# Patient Record
Sex: Female | Born: 1990 | Race: Black or African American | Hispanic: No | Marital: Single | Smoking: Current every day smoker
Health system: Southern US, Community
[De-identification: ages and names within clinical notes are randomized; demographics above are authoritative.]

---

## 2008-10-22 ENCOUNTER — Emergency Department: Payer: Self-pay | Admitting: Emergency Medicine

## 2009-12-27 ENCOUNTER — Emergency Department: Payer: Self-pay | Admitting: Emergency Medicine

## 2010-03-07 ENCOUNTER — Emergency Department: Payer: Self-pay | Admitting: Unknown Physician Specialty

## 2010-11-11 ENCOUNTER — Emergency Department: Payer: Self-pay | Admitting: Emergency Medicine

## 2011-07-19 ENCOUNTER — Emergency Department: Payer: Self-pay | Admitting: Emergency Medicine

## 2011-07-19 LAB — URINALYSIS, COMPLETE
Bacteria: NONE SEEN
Bilirubin,UR: NEGATIVE
Blood: NEGATIVE
Ketone: NEGATIVE
Ph: 6 (ref 4.5–8.0)
RBC,UR: 1 /HPF (ref 0–5)
Specific Gravity: 1.009 (ref 1.003–1.030)
WBC UR: 1 /HPF (ref 0–5)

## 2011-07-19 LAB — COMPREHENSIVE METABOLIC PANEL
Albumin: 4.1 g/dL (ref 3.4–5.0)
Alkaline Phosphatase: 72 U/L (ref 50–136)
BUN: 8 mg/dL (ref 7–18)
Calcium, Total: 9.3 mg/dL (ref 8.5–10.1)
Co2: 27 mmol/L (ref 21–32)
EGFR (African American): >60
EGFR (Non-African Amer.): >60
Glucose: 85 mg/dL (ref 65–99)
Potassium: 3.7 mmol/L (ref 3.5–5.1)
SGOT(AST): 23 U/L (ref 15–37)
SGPT (ALT): 31 U/L
Sodium: 140 mmol/L (ref 136–145)
Total Protein: 8 g/dL (ref 6.4–8.2)

## 2011-07-19 LAB — CBC WITH DIFFERENTIAL/PLATELET
Basophil #: 0.1 10*3/uL (ref 0.0–0.1)
Basophil %: 0.9 %
HCT: 44.6 % (ref 35.0–47.0)
HGB: 14.2 g/dL (ref 12.0–16.0)
MCH: 26.7 pg (ref 26.0–34.0)
MCHC: 32 g/dL (ref 32.0–36.0)
MCV: 84 fL (ref 80–100)
Monocyte %: 6.3 %
RDW: 13.6 % (ref 11.5–14.5)
WBC: 6.4 10*3/uL (ref 3.6–11.0)

## 2011-07-19 LAB — LIPASE, BLOOD: Lipase: 108 U/L (ref 73–393)

## 2011-07-19 LAB — PREGNANCY, URINE: Pregnancy Test, Urine: NEGATIVE m[IU]/mL

## 2011-07-19 LAB — AMYLASE: Amylase: 56 U/L (ref 25–115)

## 2012-03-30 IMAGING — CR DG ANKLE COMPLETE 3+V*L*
1 series · 5 of 5 positions shown · non-contrast
Comparison: none

REASON FOR EXAM: injury; pt in [place]
COMMENTS:

[Series 1: view not recorded · 0.17mm/px · 5 of 5 slices shown]
[im 1/5]
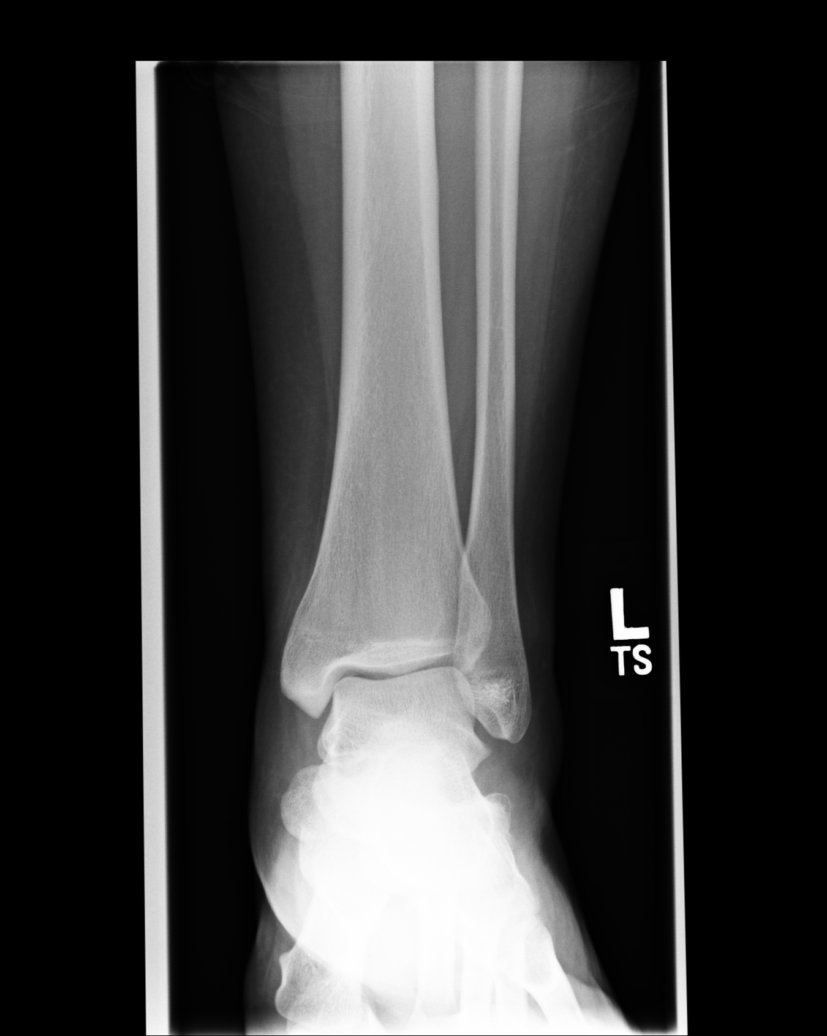
[im 2/5]
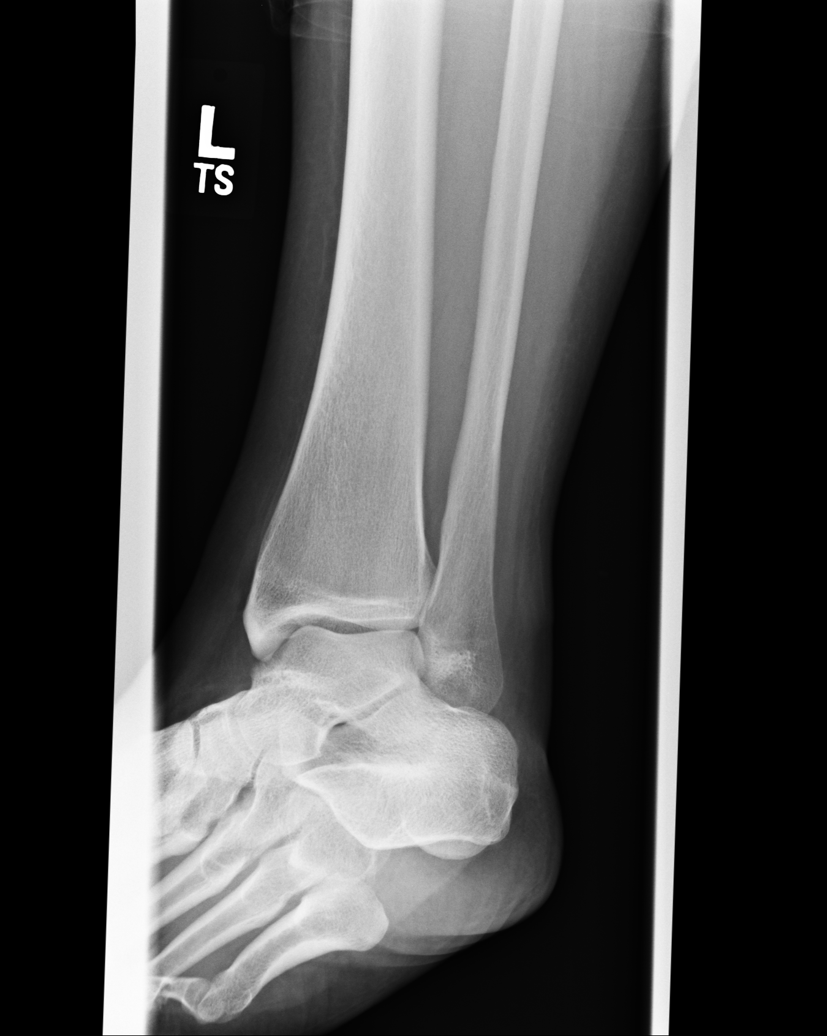
[im 3/5]
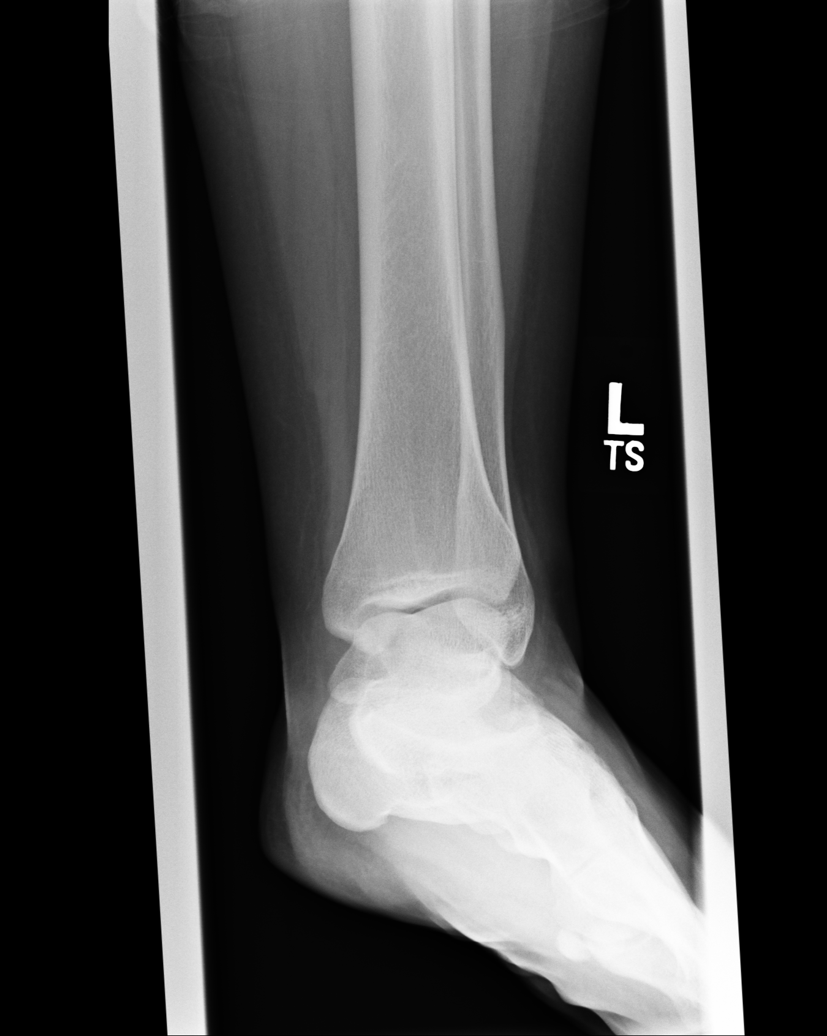
[im 4/5]
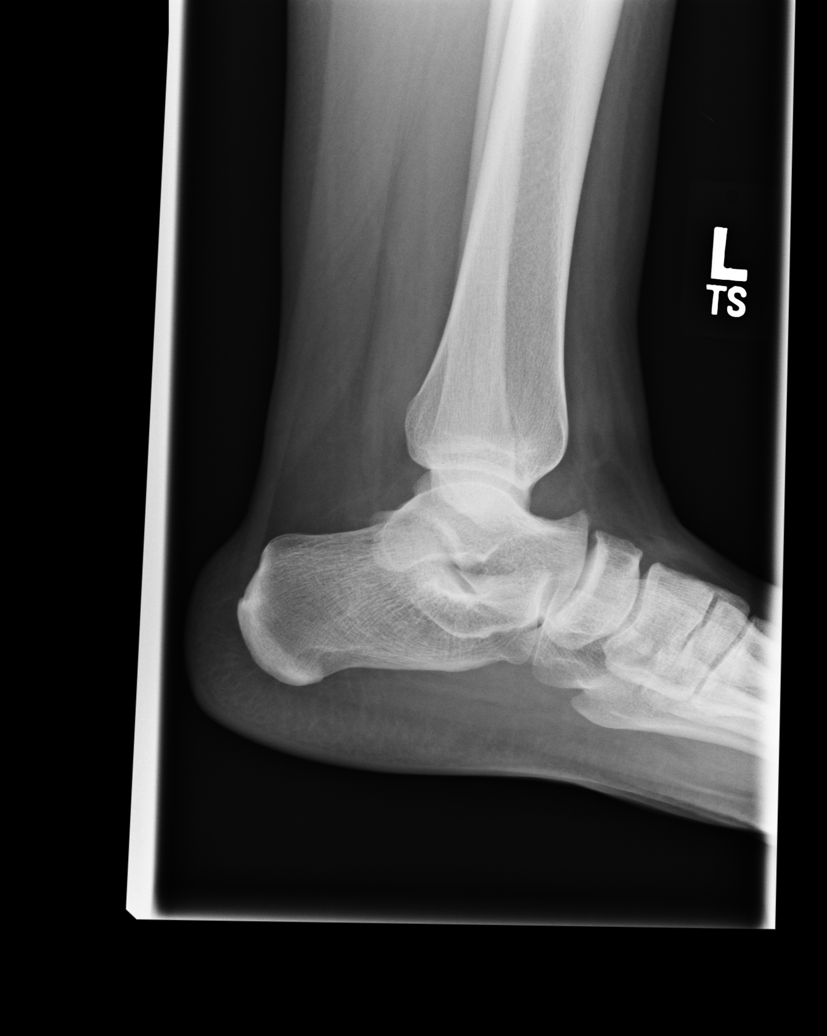
[im 5/5]
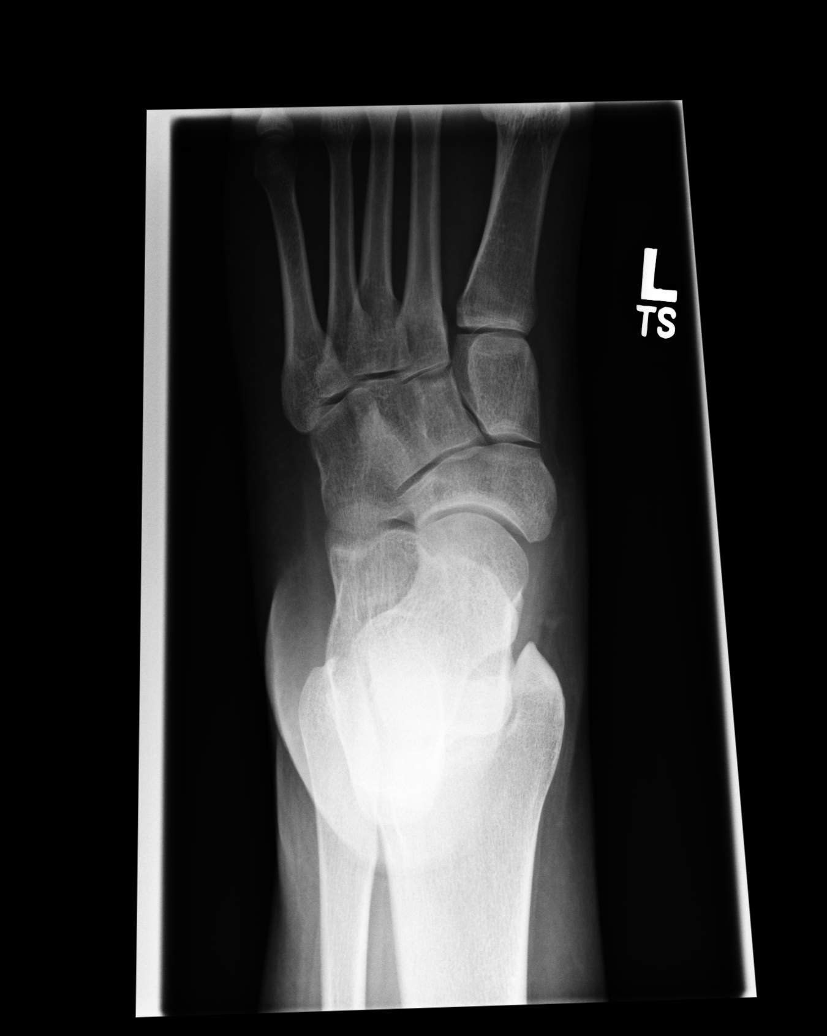

[5 of 5 positions shown; findings below may reference images not displayed]

PROCEDURE:     DXR - DXR ANKLE LEFT COMPLETE  - December 27, 2009 [DATE]

RESULT:     Five views of the left ankle are submitted. The ankle joint
mortise is preserved. The talar dome is intact. There is soft tissue
swelling over the lateral malleolus. The metatarsal bases appear intact. The
talus and calcaneus also appear intact.
IMPRESSION: I do not see acute bony abnormality of the left ankle. No
periosteal reaction is demonstrated. There is a small amount of soft tissue
swelling especially laterally. Given the patient's persistent symptoms, may
be useful to consider her for MRI of the ankle.

## 2013-01-08 ENCOUNTER — Emergency Department: Payer: Self-pay | Admitting: Emergency Medicine

## 2013-01-13 ENCOUNTER — Emergency Department: Payer: Self-pay | Admitting: Emergency Medicine

## 2014-03-01 ENCOUNTER — Emergency Department: Payer: Self-pay | Admitting: Emergency Medicine

## 2016-06-02 IMAGING — CR DG CHEST 2V
1 series · 2 of 2 positions shown · non-contrast
Comparison: None.

CLINICAL DATA: Cough, fever.

EXAM:
CHEST  2 VIEW

[Series 1: dxr chest pa (or ap) and lateral · 0.14mm/px · 2 of 2 slices shown]
[im 1/2]
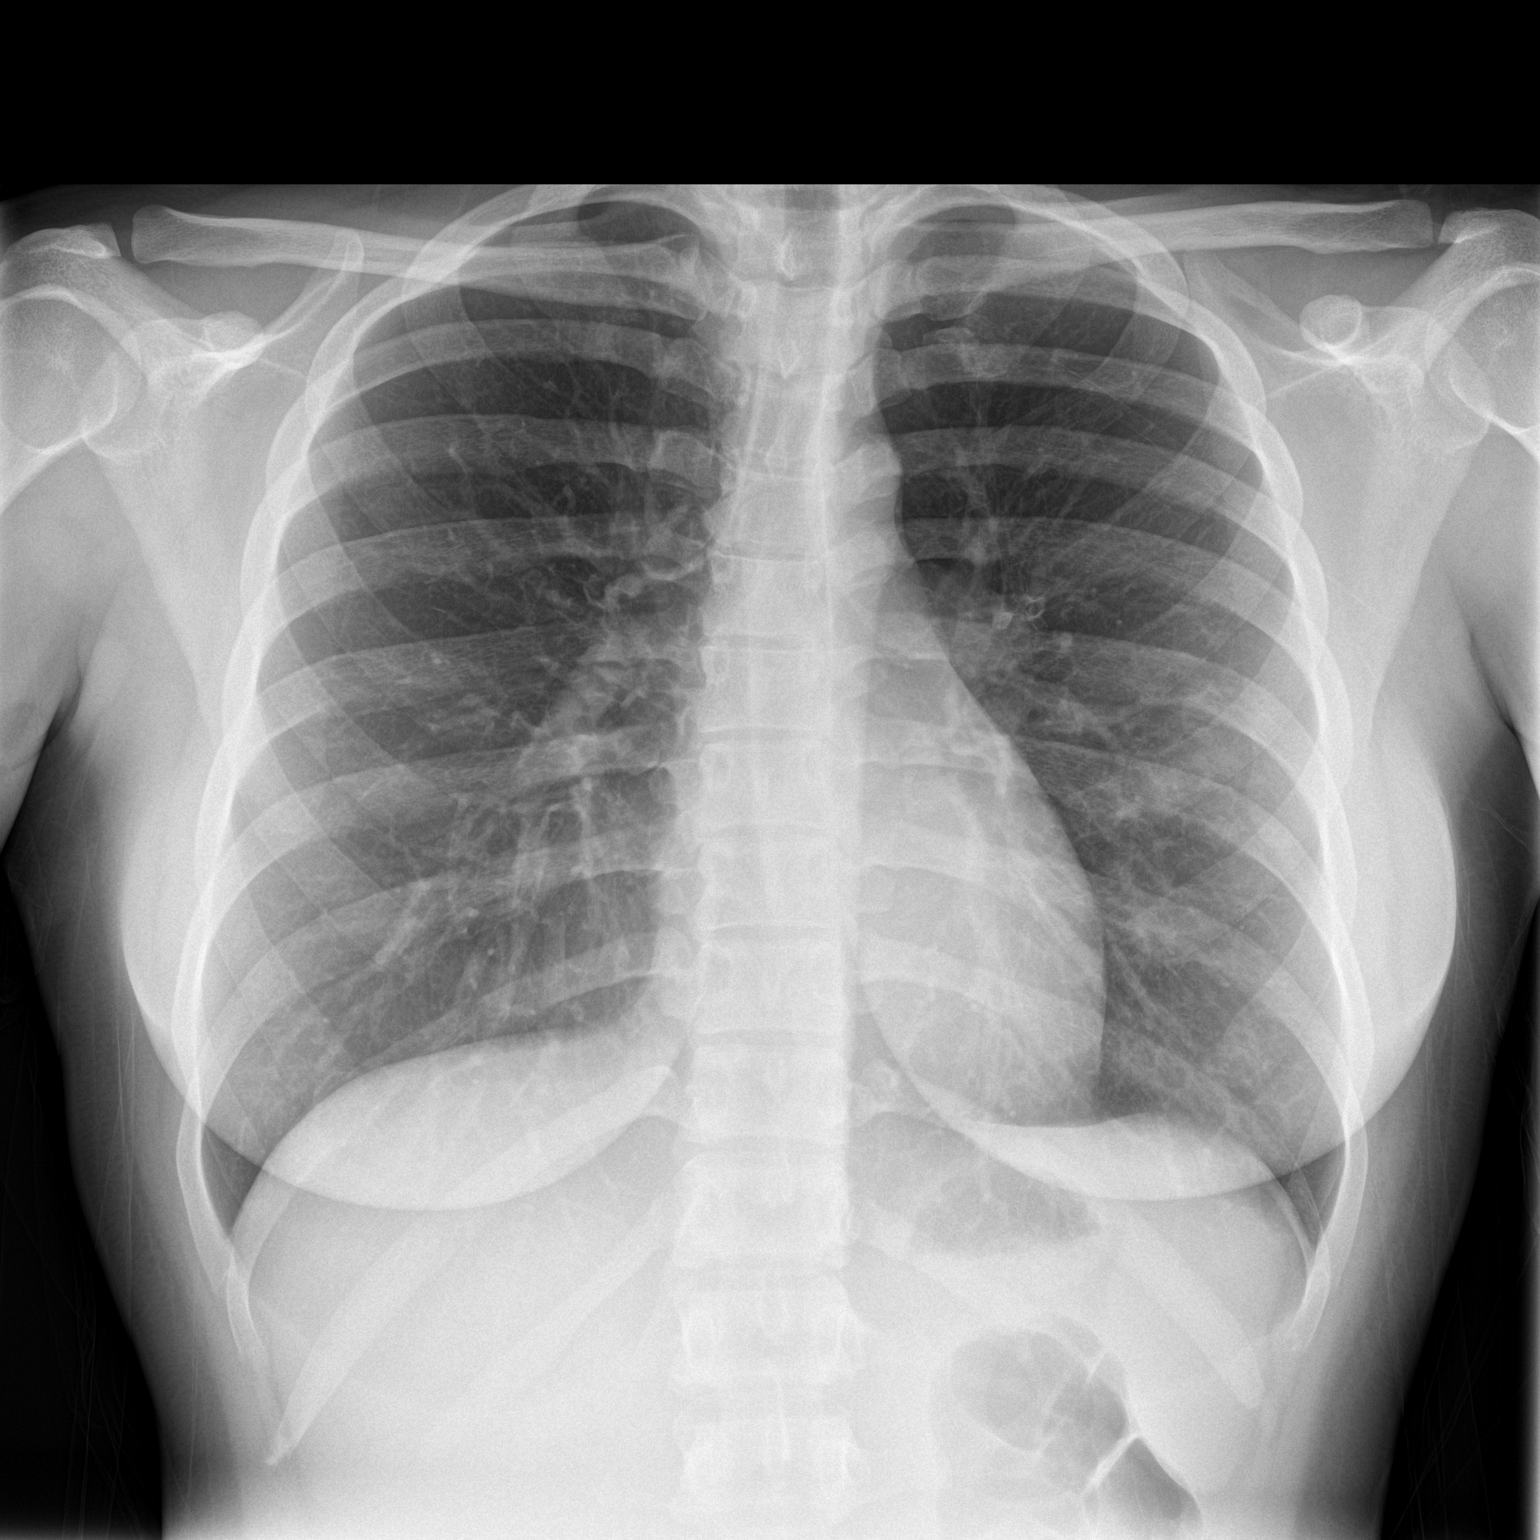
[im 2/2]
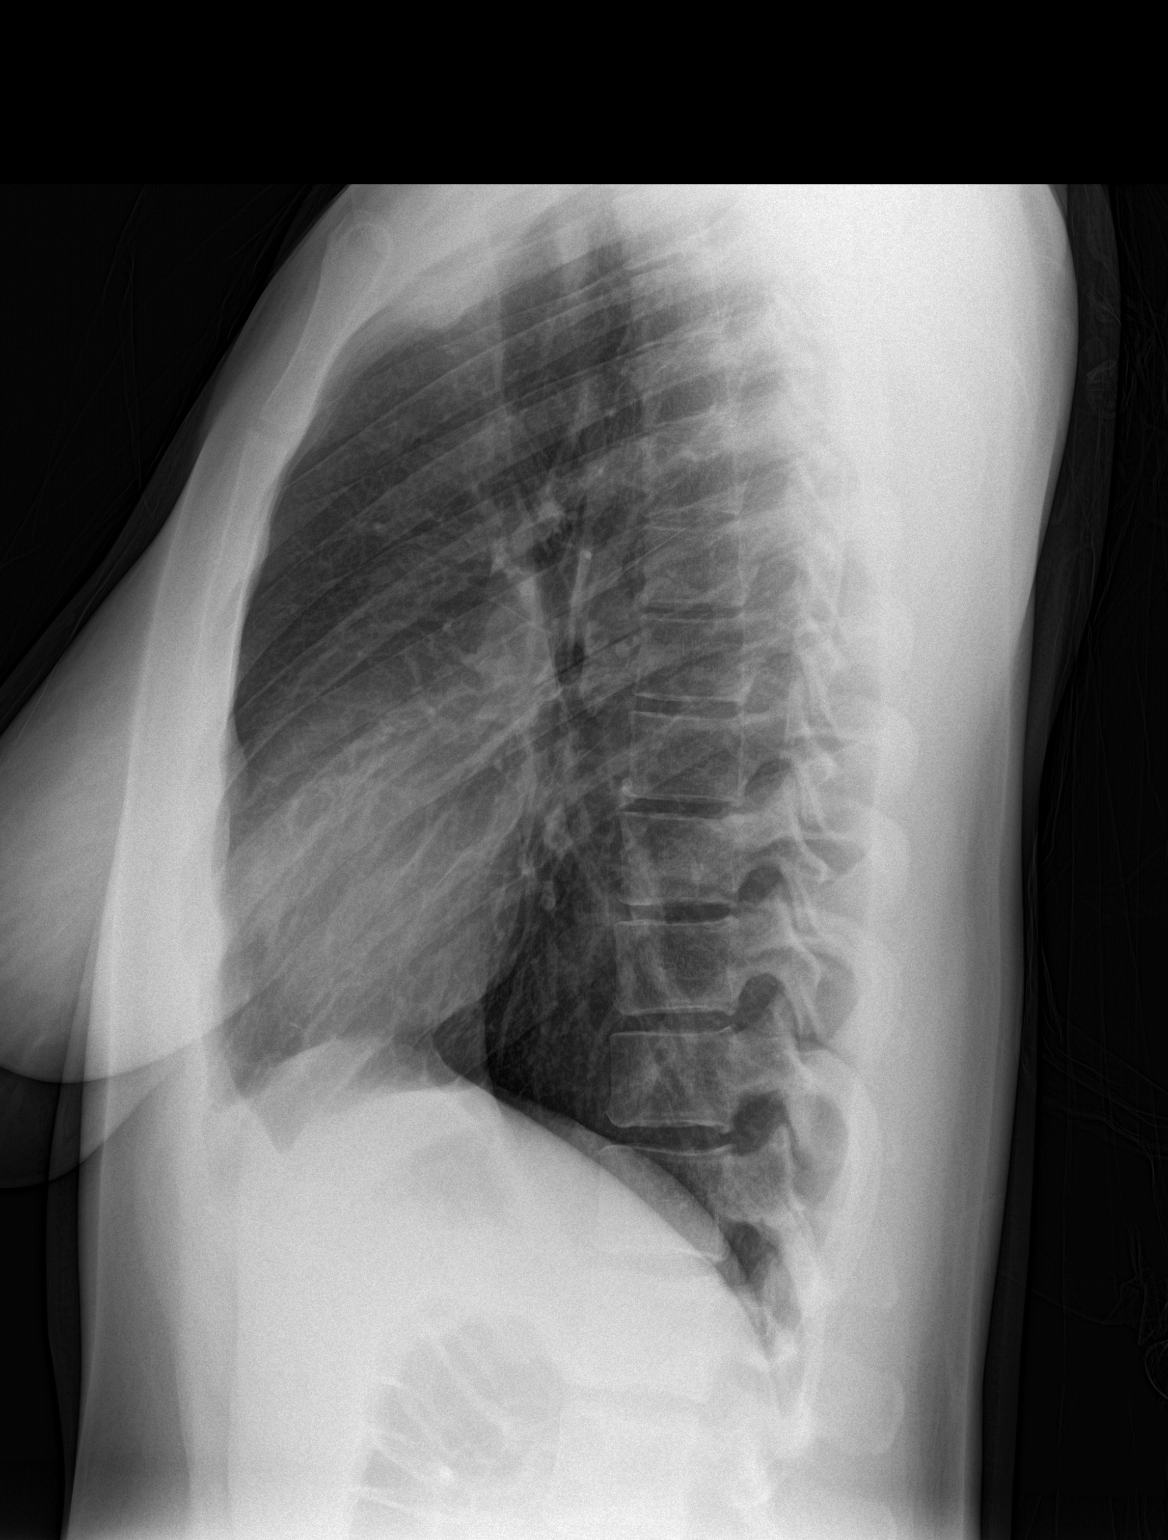

[2 of 2 positions shown; findings below may reference images not displayed]

FINDINGS: The heart size and mediastinal contours are within normal limits.
Both lungs are clear. No pneumothorax or pleural effusion is noted.
The visualized skeletal structures are unremarkable.
IMPRESSION: No acute cardiopulmonary abnormality seen.

## 2018-12-03 ENCOUNTER — Encounter: Payer: Self-pay | Admitting: Family Medicine

## 2018-12-03 ENCOUNTER — Ambulatory Visit: Payer: Self-pay | Admitting: Family Medicine

## 2018-12-03 ENCOUNTER — Other Ambulatory Visit: Payer: Self-pay

## 2018-12-03 DIAGNOSIS — Z113 Encounter for screening for infections with a predominantly sexual mode of transmission: Secondary | ICD-10-CM

## 2018-12-03 DIAGNOSIS — B9689 Other specified bacterial agents as the cause of diseases classified elsewhere: Secondary | ICD-10-CM

## 2018-12-03 DIAGNOSIS — N760 Acute vaginitis: Secondary | ICD-10-CM

## 2018-12-03 MED ORDER — METRONIDAZOLE 500 MG PO TABS: 500.0000 mg | ORAL_TABLET | Freq: Two times a day (BID) | ORAL | 0 refills | Status: AC

## 2018-12-03 NOTE — Progress Notes (Signed)
Here today for STD screening. Accepts bloodwork. Talin Rozeboom, RN Wet Prep results reviewed by provider C. Latta, NP. Patient treated for BV per provider orders. Bennett Vanscyoc, RN  

## 2018-12-03 NOTE — Progress Notes (Signed)
    STI clinic/screening visit  Subjective:  Shelley Miller is a 28 y.o. female being seen today for an STI screening visit. The patient reports they do have symptoms.  Patient has the following medical conditions:  There are no active problems to display for this patient.    Chief Complaint  Patient presents with  . SEXUALLY TRANSMITTED DISEASE    HPI  Patient reports that she has vaginal irritation and itching for several weeks.  States she used OTC yeast cream 2-3 mos ago with relief for a couple days.  Client states tat she has a h/o  BV and yeast infections.  See flowsheet for further details and programmatic requirements.    The following portions of the patient's history were reviewed and updated as appropriate: allergies, current medications, past medical history, past social history, past surgical history and problem list.  Objective:  There were no vitals filed for this visit.  Physical Exam HENT:     Mouth/Throat:     Mouth: Mucous membranes are moist.     Pharynx: Oropharynx is clear. No oropharyngeal exudate or posterior oropharyngeal erythema.  Neck:     Musculoskeletal: Neck supple. No muscular tenderness.  Abdominal:     Palpations: Abdomen is soft.     Tenderness: There is no abdominal tenderness.  Genitourinary:    General: Normal vulva.     Vagina: Vaginal discharge present.     Comments: White disch, pH=4.5, odor noted Lymphadenopathy:     Cervical: No cervical adenopathy.  Skin:    General: Skin is warm and dry.     Findings: No lesion or rash.  Neurological:     Mental Status: She is alert.    Assessment and Plan:  Shelley Miller is a 28 y.o. female presenting to the [facility] Department for STI screening  1. Screening examination for venereal disease  - [facility] - Syphilis Serology, [facility] - WET PREP FOR [place], YEAST, [facility]  2. Bacterial vaginitis Treat wet prep for BV  with Metronidazole 500 mg po BID x 1 wk.     No follow-ups on file.  No future appointments.  Hassell Done, FNP

## 2018-12-04 LAB — WET PREP FOR TRICH, YEAST, CLUE
Clue Cell Exam: POSITIVE — AB
Trichomonas Exam: NEGATIVE
Yeast Exam: NEGATIVE

## 2018-12-06 ENCOUNTER — Telehealth: Payer: Self-pay

## 2018-12-10 NOTE — Telephone Encounter (Signed)
TC from patient.  Explained d/t lab error HIV test was not completed by state lab. Patient would like to come in for re-draw tomorrow morning; appt scheduled. Aileen Fass, RN

## 2018-12-10 NOTE — Telephone Encounter (Signed)
TC from patient #, left a VM but didn't speak on VM. Aileen Fass, RN

## 2018-12-10 NOTE — Telephone Encounter (Signed)
Attempted TC to patient x 2. Both times somone picks up and hangs up. Aileen Fass, RN   RN calling to offer redraw of HIV. State lab did not complete result d/t label error.Aileen Fass, RN

## 2018-12-11 ENCOUNTER — Other Ambulatory Visit: Payer: Self-pay
